# Patient Record
Sex: Male | Born: 1945 | Race: Black or African American | Hispanic: No | Smoking: Current some day smoker
Health system: Southern US, Community
[De-identification: ages and names within clinical notes are randomized; demographics above are authoritative.]

## PROBLEM LIST (undated history)

## (undated) DIAGNOSIS — R7303 Prediabetes: Secondary | ICD-10-CM

## (undated) DIAGNOSIS — E7800 Pure hypercholesterolemia, unspecified: Secondary | ICD-10-CM

## (undated) DIAGNOSIS — I10 Essential (primary) hypertension: Secondary | ICD-10-CM

## (undated) HISTORY — DX: Prediabetes: R73.03

## (undated) HISTORY — PX: KNEE SURGERY: SHX244

## (undated) HISTORY — DX: Pure hypercholesterolemia, unspecified: E78.00

## (undated) HISTORY — DX: Essential (primary) hypertension: I10

---

## 2006-10-05 ENCOUNTER — Emergency Department (HOSPITAL_COMMUNITY): Admission: EM | Admit: 2006-10-05 | Discharge: 2006-10-05 | Payer: Self-pay | Admitting: Emergency Medicine

## 2006-10-09 ENCOUNTER — Emergency Department (HOSPITAL_COMMUNITY): Admission: EM | Admit: 2006-10-09 | Discharge: 2006-10-09 | Payer: Self-pay | Admitting: Emergency Medicine

## 2007-01-25 ENCOUNTER — Ambulatory Visit (HOSPITAL_COMMUNITY): Admission: RE | Admit: 2007-01-25 | Discharge: 2007-01-26 | Payer: Self-pay | Admitting: Orthopedic Surgery

## 2007-02-07 ENCOUNTER — Encounter: Admission: RE | Admit: 2007-02-07 | Discharge: 2007-05-08 | Payer: Self-pay | Admitting: Orthopedic Surgery

## 2009-10-22 ENCOUNTER — Emergency Department (HOSPITAL_COMMUNITY): Admission: EM | Admit: 2009-10-22 | Discharge: 2009-10-22 | Payer: Self-pay | Admitting: Family Medicine

## 2010-02-10 ENCOUNTER — Emergency Department (HOSPITAL_COMMUNITY): Admission: EM | Admit: 2010-02-10 | Discharge: 2010-02-10 | Payer: Self-pay | Admitting: Family Medicine

## 2010-09-04 ENCOUNTER — Encounter: Payer: Self-pay | Admitting: *Deleted

## 2010-09-05 ENCOUNTER — Encounter: Payer: Self-pay | Admitting: *Deleted

## 2010-12-28 NOTE — Op Note (Signed)
NAME:  Bernard Cross, Bernard Cross NO.:  [id number]   MEDICAL RECORD NO.:  [id number]          PATIENT TYPE:  AMB   LOCATION:  SDS                          FACILITY:  [facility]   PHYSICIAN:  Vania Rea. Supple, M.D.  DATE OF BIRTH:  Mar 28, 1946   DATE OF PROCEDURE:  01/25/2007  DATE OF DISCHARGE:                               OPERATIVE REPORT   PREOPERATIVE DIAGNOSIS:  Left knee medial meniscus tear.   POSTOPERATIVE DIAGNOSIS:  1. Left knee medial meniscus tear.  2. Left knee lateral meniscus tear.  3. Left knee thick fibrotic medial plica.   PROCEDURE:  1. Left knee diagnostic arthroscopy.  2. Partial medial and lateral meniscectomies.  3. Medial plica resection.   SURGEON:  Vania Rea. Supple, M.D.   ANESTHESIA:  LMA general plus local.   TOURNIQUET TIME:  None was used.   ESTIMATED BLOOD LOSS:  Minimal.   DRAINS:  None.   HISTORY:  Bernard Cross is a 60-year gentleman who has had chronic left  medial knee pain and mechanical symptoms which have failed to improve  with conservative management.  Due to his ongoing pain and functional  limitations, he is brought to the operating at this time for planned  left knee arthroscopy as described below.  Preoperatively, we counseled  Bernard Cross on treatment options as well as risks versus benefits  thereof.  The possible surgical complication including infection,  neurovascular injury, DVT, PE, persistent pain, loss of motion, and  possible need for additional surgery are reviewed.  He understands,  accepts, and agrees with our planned procedure.   PROCEDURE IN DETAIL:  After undergoing routine preop evaluation, the  patient received prophylactic antibiotics.  A knee block anesthetic was  placed in the holding area by the anesthesia department.  He was placed  supine on the operating table and underwent smooth induction of an LMA  general anesthesia.  The left leg was placed in the leg holder and  sterilely prepped and draped in a  standard fashion.   Standard arthroscopy portals were established and diagnostic arthroscopy  was performed.  The suprapatellar pouch and gutters were clear, although  there were several small cartilaginous loose bodies that we removed  through the arthroscopic cannula.  There was a very thick and large  fibrotic medial plica and this was resected back to its capsular margins  with a shaver and then the Arthrex wand was used to obtain hemostasis.  The patellofemoral joint showed normal articular cartilage and normal  patellar tracking.  The acromial notch showed the ACL to be intact with  a negative intraoperative Lachman.  Medially, there was a radial tear  involving the posterior horn of the medial meniscus.  This area was  trimmed back to a stable peripheral margin with a smooth contour and  then a shaver was brought in for final contouring and removal of  meniscal fragments.  Estimated approximately 25-30% of the meniscus was  excised.  The remaining peripheral rim of the meniscus was stable.  The  articular surfaces were in good condition medially.  Laterally, the  articular surfaces were  in good condition.  There was a degenerative  tear in the mid third of the lateral meniscus which was trimmed back to  a stable peripheral margin with a shaver.   At this point, final inspection and irrigation was completed.  We did  use the Arthrex wand to obtain hemostasis beneath the medial meniscus in  the area which had been partially excised and debrided.  Final  irrigation was completed.  Hemostasis was obtained.  Fluid and  instruments were removed.  A combination of Marcaine with epinephrine  was instilled into the joint and around the portals.  The portals were  closed with Steri-Strips.  A bulky dry dressing was then wrapped about  the left knee and the left leg was wrapped with an Ace bandages.  The  patient was then transferred to the recovery room in stable condition.      Vania Rea. Supple, M.D.  Electronically Signed     KMS/MEDQ  D:  01/25/2007  T:  01/25/2007  Job:  [id number]

## 2011-06-02 LAB — DIFFERENTIAL
Lymphocytes Relative: 50 — ABNORMAL HIGH
Lymphs Abs: 2.5
Monocytes Absolute: 0.4
Monocytes Relative: 8
Neutro Abs: 2
Neutrophils Relative %: 39 — ABNORMAL LOW

## 2011-06-02 LAB — URINALYSIS, ROUTINE W REFLEX MICROSCOPIC
Leukocytes, UA: NEGATIVE
Nitrite: NEGATIVE
Specific Gravity, Urine: 1.026 (ref 1.005–1.035)
Urobilinogen, UA: 1
pH: 6

## 2011-06-02 LAB — COMPREHENSIVE METABOLIC PANEL
Albumin: 3.5
Alkaline Phosphatase: 57
BUN: 11
Calcium: 8.6
Creatinine, Ser: 0.91
Potassium: 3.6
Total Protein: 7.1

## 2011-06-02 LAB — PROTIME-INR: INR: 1

## 2011-06-02 LAB — CBC
Platelets: 228
RBC: 5
WBC: 5.1

## 2011-06-02 LAB — APTT: aPTT: 30

## 2012-09-25 ENCOUNTER — Ambulatory Visit
Admission: RE | Admit: 2012-09-25 | Discharge: 2012-09-25 | Disposition: A | Discharge status: Home / Self Care | Payer: No Typology Code available for payment source | Source: Ambulatory Visit | Attending: Specialist | Admitting: Specialist

## 2012-09-25 ENCOUNTER — Other Ambulatory Visit: Payer: Self-pay | Admitting: Specialist

## 2012-09-25 DIAGNOSIS — R7611 Nonspecific reaction to tuberculin skin test without active tuberculosis: Secondary | ICD-10-CM

## 2013-07-22 IMAGING — CR DG CHEST 1V
1 series · 1 of 1 positions shown · non-contrast
Comparison: Chest x-ray of 01/23/2007

CLINICAL DATA: Positive TB test, cough, smoking history

CHEST - 1 VIEW

[w chest pa]
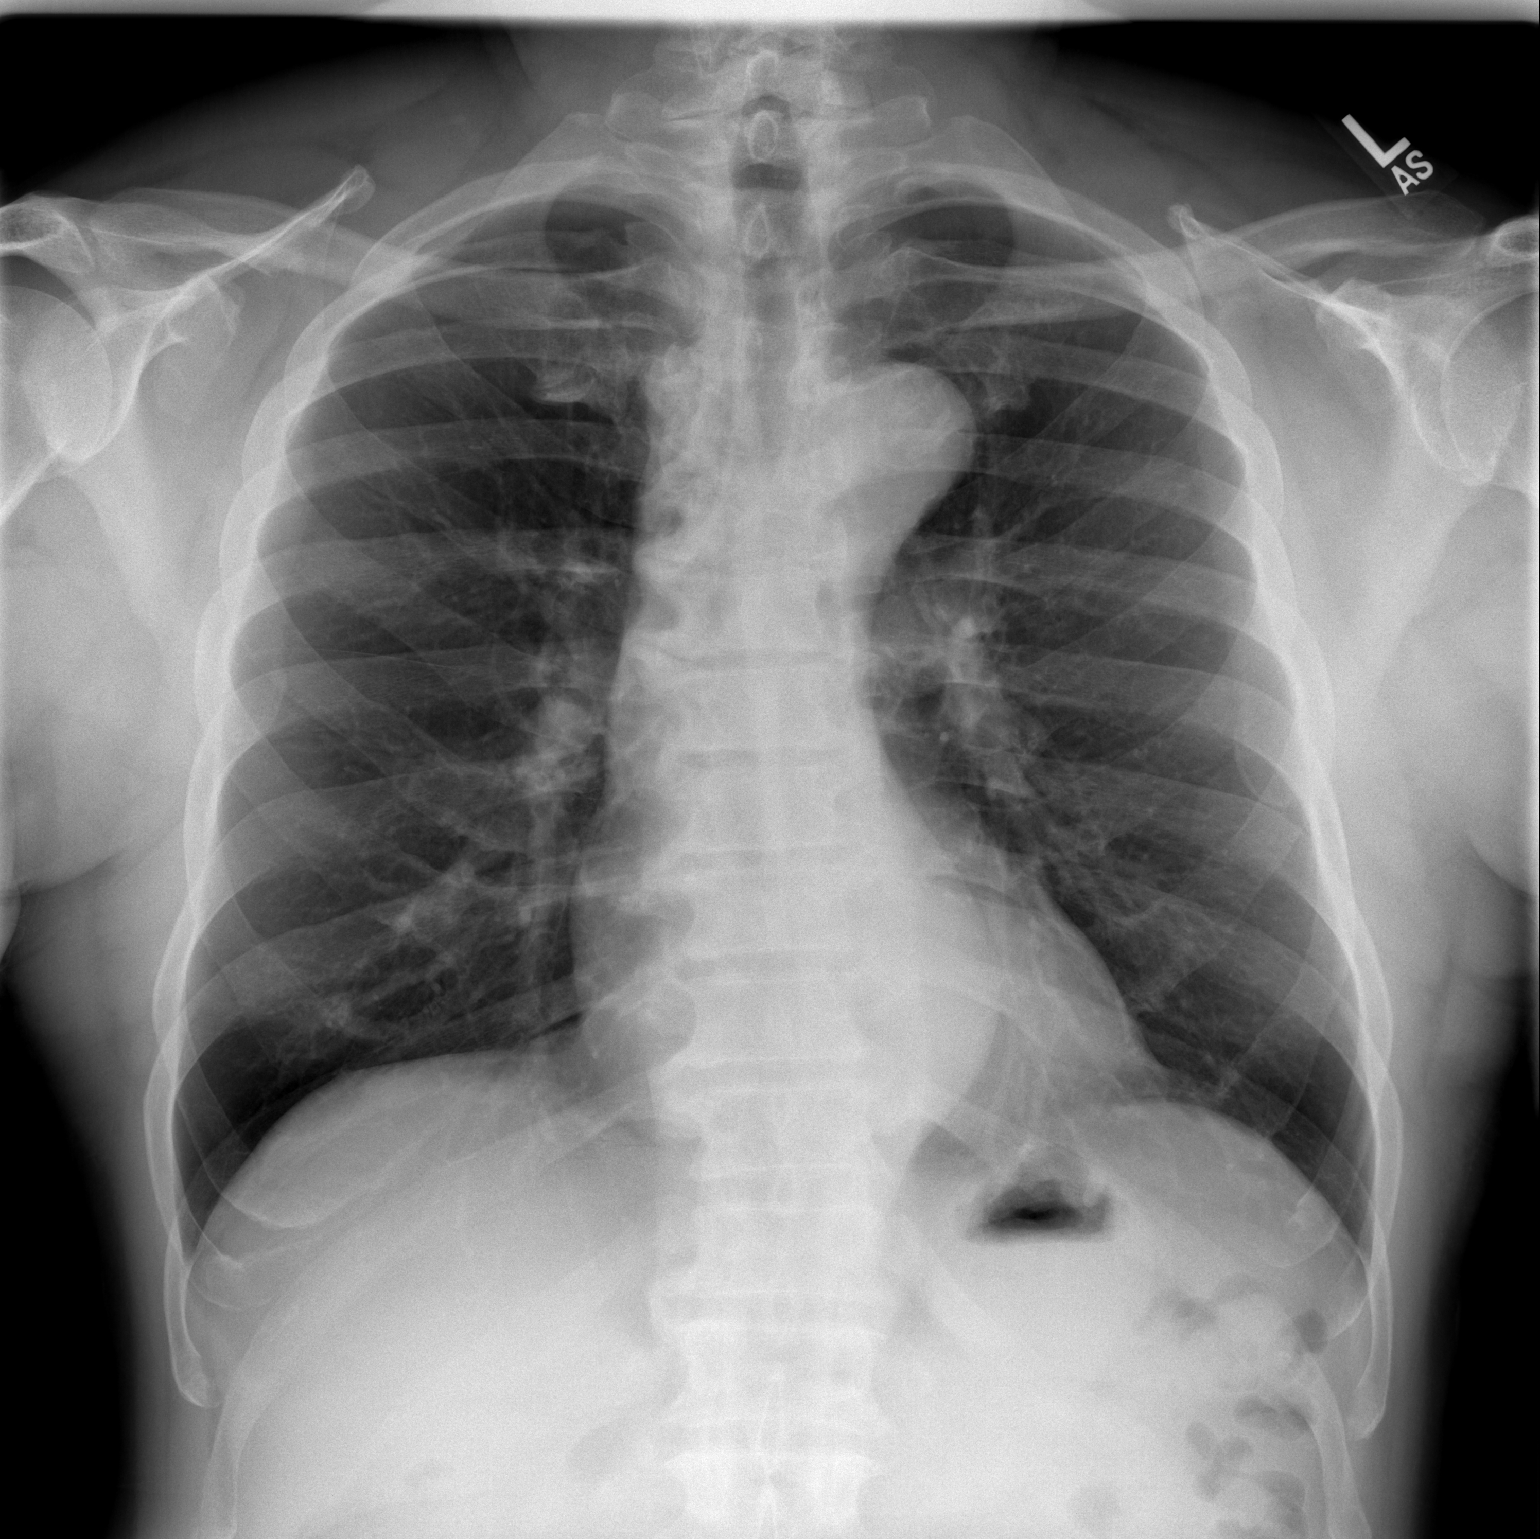

[1 of 1 positions shown; findings below may reference images not displayed]

FINDINGS: No active infiltrate or effusion is seen.  No sequela of
prior tuberculous infection is noted.  Mediastinal contours appear
normal.  The heart is mildly enlarged.  The descending thoracic
aorta is ectatic.  There are degenerative changes throughout the
thoracic spine.
IMPRESSION: No active lung disease.  Stable mild cardiomegaly with ectatic
descending thoracic aorta.

## 2013-07-23 ENCOUNTER — Emergency Department (INDEPENDENT_AMBULATORY_CARE_PROVIDER_SITE_OTHER)
Admission: EM | Admit: 2013-07-23 | Discharge: 2013-07-23 | Disposition: A | Discharge status: Home / Self Care | Payer: Medicare Other | Source: Home / Self Care | Attending: Family Medicine | Admitting: Family Medicine

## 2013-07-23 ENCOUNTER — Encounter (HOSPITAL_COMMUNITY): Payer: Self-pay | Admitting: Emergency Medicine

## 2013-07-23 DIAGNOSIS — M549 Dorsalgia, unspecified: Secondary | ICD-10-CM

## 2013-07-23 DIAGNOSIS — M542 Cervicalgia: Secondary | ICD-10-CM

## 2013-07-23 MED ORDER — DICLOFENAC POTASSIUM 50 MG PO TABS: 50.0000 mg | ORAL_TABLET | Freq: Three times a day (TID) | ORAL | Status: DC

## 2013-07-23 MED ORDER — CYCLOBENZAPRINE HCL 5 MG PO TABS: 5.0000 mg | ORAL_TABLET | Freq: Three times a day (TID) | ORAL | Status: DC | PRN
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
## 2013-08-07 ENCOUNTER — Emergency Department (INDEPENDENT_AMBULATORY_CARE_PROVIDER_SITE_OTHER)
Admission: EM | Admit: 2013-08-07 | Discharge: 2013-08-07 | Disposition: A | Discharge status: Home / Self Care | Payer: Medicare Other | Source: Home / Self Care | Attending: Family Medicine | Admitting: Family Medicine

## 2013-08-07 ENCOUNTER — Encounter (HOSPITAL_COMMUNITY): Payer: Self-pay | Admitting: Emergency Medicine

## 2013-08-07 DIAGNOSIS — J40 Bronchitis, not specified as acute or chronic: Secondary | ICD-10-CM

## 2013-08-07 MED ORDER — AZITHROMYCIN 250 MG PO TABS: 250.0000 mg | ORAL_TABLET | Freq: Every day | ORAL | Status: DC

## 2013-08-07 MED ORDER — HYDROCODONE-ACETAMINOPHEN 5-325 MG PO TABS: 0.5000 | ORAL_TABLET | Freq: Every evening | ORAL | Status: DC | PRN

## 2013-08-07 NOTE — ED Notes (Signed)
67 yr old is here today with complaints of cough-dry, chest congestion; Head aches for 3 dys.

## 2013-08-07 NOTE — ED Provider Notes (Signed)
Bernard Cross is a 67 y.o. male who presents to Urgent Care today for  1) cough nasal congestion, ear pressure, and nasal discharge. No chest pain shortness of breath nausea vomiting or diarrhea. Patient has tried some over-the-counter medications which did not help much. He has been exposed to a lot of sick people recently. 2) hypertension: Patient with elevated blood pressure. He has never been diagnosed with hypertension before. He does not take medications yet.   History reviewed. No pertinent past medical history. History  Substance Use Topics  . Smoking status: Current Some Day Smoker  . Smokeless tobacco: Not on file  . Alcohol Use: Yes   ROS as above Medications reviewed. No current facility-administered medications for this encounter.   Current Outpatient Prescriptions  Medication Sig Dispense Refill  . azithromycin (ZITHROMAX) 250 MG tablet Take 1 tablet (250 mg total) by mouth daily. Take first 2 tablets together, then 1 every day until finished.  6 tablet  0  . HYDROcodone-acetaminophen (NORCO/VICODIN) 5-325 MG per tablet Take 0.5 tablets by mouth at bedtime as needed (cough).  6 tablet  0    Exam:  BP 167/105  Pulse 88  Temp(Src) 100.7 F (38.2 C) (Oral)  Resp 20  SpO2 100% Gen: Well NAD HEENT: EOMI,  MMM, tympanic membranes are normal appearing bilaterally. Posterior pharynx with mild erythema. Lungs: Normal work of breathing. Rhonchorous breath sounds bilaterally. Otherwise clear Heart: RRR no MRG Abd: NABS, Soft. NT, ND Exts: Non edematous BL  LE, warm and well perfused.   No results found for this or any previous visit (from the past 24 hour(s)). No results found.  Assessment and Plan: 67 y.o. male with probable bronchitis. Patient refuses chest x-ray and blood work today. We'll treat with azithromycin and low-dose Norco for cough.  As for blood pressure. He might start patient on antihypertensives however he refuses blood draw today. We'll followup with  primary care provider. Discussed warning signs or symptoms. Please see discharge instructions. Patient expresses understanding.      Rodolph Bong, MD 08/07/13 1340

## 2019-10-17 ENCOUNTER — Ambulatory Visit: Payer: Medicare Other | Attending: Internal Medicine

## 2019-10-17 DIAGNOSIS — Z23 Encounter for immunization: Secondary | ICD-10-CM | POA: Insufficient documentation

## 2019-10-17 NOTE — Progress Notes (Signed)
   Covid-19 Vaccination Clinic  Name:  ELDEN BRUCATO    MRN: [id number] DOB: May 22, 1946  10/17/2019  Mr. Zumstein was observed post Covid-19 immunization for 15 minutes without incident. He was provided with Vaccine Information Sheet and instruction to access the V-Safe system.   Mr. Roudebush was instructed to call 911 with any severe reactions post vaccine: Marland Kitchen Difficulty breathing  . Swelling of face and throat  . A fast heartbeat  . A bad rash all over body  . Dizziness and weakness   Immunizations Administered    Name Date Dose VIS Date Route   Pfizer COVID-19 Vaccine 10/17/2019 10:18 AM 0.3 mL 07/26/2019 Intramuscular   Manufacturer: [employer], [employer]   Lot: [id number]   NDC: [address]-3

## 2019-11-13 ENCOUNTER — Ambulatory Visit: Payer: Medicare Other | Attending: Internal Medicine

## 2019-11-13 DIAGNOSIS — Z23 Encounter for immunization: Secondary | ICD-10-CM

## 2019-11-13 NOTE — Progress Notes (Signed)
   Covid-19 Vaccination Clinic  Name:  NOBEL BRAR    MRN: [id number] DOB: 1946/06/07  11/13/2019  Mr. Wojnarowski was observed post Covid-19 immunization for 15 minutes without incident. He was provided with Vaccine Information Sheet and instruction to access the V-Safe system.   Mr. Gerry was instructed to call 911 with any severe reactions post vaccine: Marland Kitchen Difficulty breathing  . Swelling of face and throat  . A fast heartbeat  . A bad rash all over body  . Dizziness and weakness   Immunizations Administered    Name Date Dose VIS Date Route   Pfizer COVID-19 Vaccine 11/13/2019 11:23 AM 0.3 mL 07/26/2019 Intramuscular   Manufacturer: [employer], [employer]   Lot: [id number]   NDC: [address]-8

## 2022-08-22 ENCOUNTER — Ambulatory Visit: Payer: Medicare Other | Admitting: Neurology

## 2022-08-22 ENCOUNTER — Encounter: Payer: Self-pay | Admitting: Neurology

## 2022-08-30 ENCOUNTER — Ambulatory Visit: Payer: Medicare Other | Admitting: Neurology

## 2022-10-27 ENCOUNTER — Ambulatory Visit (INDEPENDENT_AMBULATORY_CARE_PROVIDER_SITE_OTHER): Payer: 59 | Admitting: Neurology

## 2022-10-27 ENCOUNTER — Encounter: Payer: Self-pay | Admitting: Neurology

## 2022-10-27 VITALS — BP 138/92 | HR 72 | Ht 74.0 in | Wt 228.0 lb

## 2022-10-27 DIAGNOSIS — I639 Cerebral infarction, unspecified: Secondary | ICD-10-CM | POA: Diagnosis not present

## 2022-10-27 DIAGNOSIS — R569 Unspecified convulsions: Secondary | ICD-10-CM | POA: Diagnosis not present

## 2022-10-27 MED ORDER — LEVETIRACETAM 500 MG PO TABS: 500.0000 mg | ORAL_TABLET | Freq: Two times a day (BID) | ORAL | 11 refills | Status: DC

## 2022-10-27 MED ORDER — LEVETIRACETAM 500 MG PO TABS: 500.0000 mg | ORAL_TABLET | Freq: Two times a day (BID) | ORAL | 3 refills | Status: DC

## 2022-10-27 NOTE — Progress Notes (Signed)
Chief Complaint  Patient presents with   New Patient (Initial Visit)    RM 17, alone. Stroke f/u. Had arm weakness. Michela Pitcher his last seizure was one month ago, thinks his sz meds is piracetam.      ASSESSMENT AND PLAN  Bernard Cross is a 77 y.o. 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, 2022, Stroke on Dec 29, 2021,  Stroke was treated at [place], [place], the description of most suggestive of left MCA stroke, presenting with aphasia, mild right-sided weakness, still has mild hesitation in speech,  Reported MRI of the brain was done recently at outside facility, I could not get records through epic system, bring MRI film to review at next visit  EEG  Refill Keppra 500 mg twice daily  No driving until seizure-free for 6 months  Keep aspirin 81 mg daily    Return To Clinic With NP In 6 Months   DIAGNOSTIC DATA (LABS, IMAGING, TESTING) - I reviewed patient records, labs, notes, testing and imaging myself where available.   MEDICAL HISTORY:  Bernard Cross is a 77 year old right-handed male, seen in request by his primary care from [facility] doctor Kristie Cowman, for evaluation of seizure, he drove himself to clinic today October 27, 2022   I reviewed and summarized the referring note. PMHX HLD HTN  He is a native of [place], he immigrated to states about 10 years ago, when he visiting his home country in May 2023, suffered a stroke on May 17 presenting with right-sided weakness, language difficulty, regained significant recovery, since he returned to [place], he stayed with his sister in [place] area,  On September 07, 2022, he had sudden onset forceful head turning towards the right side, then lost consciousness, had witnessed the seizure, was treated at [facility] system,  CT head showed no acute abnormality, he was discharged with Keppra 500 mg twice daily CT cervical spine showed no acute bony abnormality, degenerative  changes, ossification of posterior longitudinal ligament, 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  PHYSICAL EXAMNIATION:  Gen: NAD, conversant, well nourised, well groomed                     Cardiovascular: Regular rate rhythm, no peripheral edema, warm, nontender. Eyes: Conjunctivae clear without exudates or hemorrhage Neck: Supple, no carotid bruits. Pulmonary: Clear to auscultation bilaterally   NEUROLOGICAL EXAM:  MENTAL STATUS: Speech/cognition: Awake, alert, oriented to history taking and casual conversation, but hesitating his speech CRANIAL NERVES: CN II: Visual fields are full to confrontation. Pupils are round equal and briskly reactive to light. CN III, IV, VI: extraocular movement are normal. No ptosis. CN V: Facial sensation is intact to light touch CN VII: Face is symmetric with normal eye closure  CN VIII: Hearing is normal to causal conversation. CN IX, X: Phonation is normal. CN XI: Head turning and shoulder shrug are intact  MOTOR: There is no pronator drift of out-stretched arms. Muscle bulk and tone are normal. Muscle strength is normal.  REFLEXES: Reflexes are 2+ and symmetric at the biceps, triceps, knees, and ankles. Plantar responses are  flexor.  SENSORY: Intact to light touch, pinprick and vibratory sensation are intact in fingers and toes.  COORDINATION: There is no trunk or limb dysmetria noted.  GAIT/STANCE: Posture is normal. Gait is steady with normal steps, base, arm swing, and turning. Heel and toe walking are normal. Tandem gait is normal.  Romberg is absent.  REVIEW OF SYSTEMS:  Full 14 system review of systems performed and notable only for as  above All other review of systems were negative.   ALLERGIES: Allergies  Allergen Reactions   Chloroquine Itching   Iodipamide Meglumine Rash    HOME MEDICATIONS: Current Outpatient Medications  Medication Sig Dispense Refill   aspirin 325 MG tablet Take 325 mg by mouth daily.     atorvastatin (LIPITOR) 40 MG tablet Take 40 mg by mouth at bedtime.     losartan (COZAAR) 50 MG tablet Take 50 mg by mouth daily.     Piracetam POWD by Does not apply route.     No current facility-administered medications for this visit.    PAST MEDICAL HISTORY: Past Medical History:  Diagnosis Date   High cholesterol    Hypertension    Prediabetes     PAST SURGICAL HISTORY: Past Surgical History:  Procedure Laterality Date   KNEE SURGERY      FAMILY HISTORY: Family History  Problem Relation Age of Onset   Breast cancer Mother    Stroke Father     SOCIAL HISTORY: Social History   Socioeconomic History   Marital status: Divorced    Spouse name: Not on file   Number of children: Not on file   Years of education: Not on file   Highest education level: Not on file  Occupational History   Not on file  Tobacco Use   Smoking status: Some Days   Smokeless tobacco: Not on file  Substance and Sexual Activity   Alcohol use: Yes   Drug use: No   Sexual activity: Not Currently  Other Topics Concern   Not on file  Social History Narrative   Not on file   Social Determinants of Health   Financial Resource Strain: Not on file  Food Insecurity: Not on file  Transportation Needs: Not on file  Physical Activity: Not on file  Stress: Not on file  Social Connections: Not on file  Intimate Partner Violence: Not on file      Marcial Pacas, M.D. Ph.D.  [facility] [address], [place], [place] [address] Ph: [phone] Fax: [phone]  CC:  Kristie Cowman, MD [place],  [place] [address]  Kristie Cowman, MD

## 2022-10-27 NOTE — Patient Instructions (Signed)
NO driving until seizure free for 6 months.  Bring MRI brain CD for review.

## 2022-11-09 ENCOUNTER — Other Ambulatory Visit: Payer: 59 | Admitting: *Deleted

## 2022-11-21 ENCOUNTER — Ambulatory Visit (INDEPENDENT_AMBULATORY_CARE_PROVIDER_SITE_OTHER): Payer: 59 | Admitting: Neurology

## 2022-11-21 DIAGNOSIS — R569 Unspecified convulsions: Secondary | ICD-10-CM

## 2022-11-21 DIAGNOSIS — I639 Cerebral infarction, unspecified: Secondary | ICD-10-CM

## 2022-12-22 NOTE — Procedures (Signed)
   HISTORY: 77 year old male presenting with seizure,  TECHNIQUE:  This is a routine 16 channel EEG recording with one channel devoted to a limited EKG recording.  It was performed during wakefulness, drowsiness and asleep.  Hyperventilation and photic stimulation were performed as activating procedures.  There are minimum muscle and movement artifact noted.  Upon maximum arousal, posterior dominant waking rhythm consistent of rhythmic alpha range activity. Activities are symmetric over the bilateral posterior derivations and attenuated with eye opening.  Photic stimulation did not alter the tracing.  Hyperventilation produced mild/moderate buildup with higher amplitude and the slower activities noted.  During EEG recording, patient developed drowsiness and no deeper stage of sleep was achieved During EEG recording, there was no epileptiform discharge noted.  EKG demonstrate normal sinus rhythm.  CONCLUSION: This is a  normal awake EEG.  There is no electrodiagnostic evidence of epileptiform discharge.  Levert Feinstein, M.D. Ph.D.  [facility] [address] [place], [place] [address] Phone: [phone] Fax:      [phone]

## 2022-12-26 ENCOUNTER — Telehealth: Payer: Self-pay | Admitting: Neurology

## 2022-12-26 DIAGNOSIS — R479 Unspecified speech disturbances: Secondary | ICD-10-CM

## 2022-12-26 DIAGNOSIS — R569 Unspecified convulsions: Secondary | ICD-10-CM

## 2022-12-26 DIAGNOSIS — I639 Cerebral infarction, unspecified: Secondary | ICD-10-CM

## 2022-12-26 NOTE — Telephone Encounter (Signed)
Pt is asking for a call with results to his brain scan

## 2022-12-26 NOTE — Telephone Encounter (Signed)
Call to patient, made aware of normal EEG per Dr. Terrace Arabia. Patient is wanting Dr. Terrace Arabia to call and explain further. Attempted to offer further explanation and he would like Dr. Terrace Arabia to follow up. I explained only results we had were EEG and not MRI. Patient verbalized understanding. Will inform Dr. Terrace Arabia.

## 2022-12-27 DIAGNOSIS — R479 Unspecified speech disturbances: Secondary | ICD-10-CM | POA: Insufficient documentation

## 2022-12-27 NOTE — Telephone Encounter (Signed)
I called patient, explained that normal EEG does not rule out the possibility of seizure,  he could continue Keppra, 500 twice a day,  He now lives in [place], once for speech therapy, will call back to inform us the facility he wished to go

## 2023-04-27 ENCOUNTER — Ambulatory Visit: Payer: 59 | Admitting: Adult Health

## 2023-05-16 ENCOUNTER — Encounter: Payer: Self-pay | Admitting: Adult Health

## 2023-05-16 ENCOUNTER — Ambulatory Visit (INDEPENDENT_AMBULATORY_CARE_PROVIDER_SITE_OTHER): Payer: 59 | Admitting: Adult Health

## 2023-05-16 VITALS — BP 123/83 | HR 71 | Ht 74.0 in | Wt 226.0 lb

## 2023-05-16 DIAGNOSIS — R569 Unspecified convulsions: Secondary | ICD-10-CM

## 2023-05-16 DIAGNOSIS — I69398 Other sequelae of cerebral infarction: Secondary | ICD-10-CM | POA: Diagnosis not present

## 2023-05-16 DIAGNOSIS — I639 Cerebral infarction, unspecified: Secondary | ICD-10-CM | POA: Diagnosis not present

## 2023-05-16 MED ORDER — LEVETIRACETAM 500 MG PO TABS: 500.0000 mg | ORAL_TABLET | Freq: Two times a day (BID) | ORAL | 3 refills | Status: DC

## 2023-05-16 MED ORDER — LEVETIRACETAM 500 MG PO TABS: 500.0000 mg | ORAL_TABLET | Freq: Two times a day (BID) | ORAL | 3 refills | Status: AC

## 2023-05-16 NOTE — Patient Instructions (Addendum)
Continue Keppra 500 mg twice daily for seizure prevention  Will follow up with Dr. Terrace Arabia regarding benefit with botox for right hand stiffness  Please let me know if your leg numbness worsens, if you start to experience any numbness/tingling in arm and face in addition to leg or any worsening weakness, please call 911 immediately   Continue aspirin 81 mg daily  and atorvastatin for secondary stroke prevention managed/prescribed by PCP  Continue to follow up with PCP regarding blood pressure and cholesterol management  Maintain strict control of hypertension with blood pressure goal below 130/90 and cholesterol with LDL cholesterol (bad cholesterol) goal below 70 mg/dL.   Signs of a Stroke? Follow the BEFAST method:  Balance Watch for a sudden loss of balance, trouble with coordination or vertigo Eyes Is there a sudden loss of vision in one or both eyes? Or double vision?  Face: Ask the person to smile. Does one side of the face droop or is it numb?  Arms: Ask the person to raise both arms. Does one arm drift downward? Is there weakness or numbness of a leg? Speech: Ask the person to repeat a simple phrase. Does the speech sound slurred/strange? Is the person confused ? Time: If you observe any of these signs, call 911.   Please try to get records from hospital where you were evaluated for your stroke - would like to review your MRI brain and also look to see if vessel of your head and neck were completed (CTA and MRA).      Followup in the future with me in 1 year or call earlier if needed       Thank you for coming to see [place] at [facility]. I hope we have been able to provide you high quality care today.  You may receive a patient satisfaction survey over the next few weeks. We would appreciate your feedback and comments so that we may continue to improve ourselves and the health of our patients.

## 2023-05-16 NOTE — Progress Notes (Signed)
Chief Complaint  Patient presents with   Follow-up    Rm 3, here alone  Pt is here for seizure follow up. Pt states he is doing well, no seizure activity since he had his stroke.      ASSESSMENT AND PLAN  Bernard Cross is a 77 y.o. 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 1 mo onset of RLE numbness (present after stroke but resolved). Discussed repeat MRI brain to ensure no new stroke or extension of prior stroke but declines interest at this time. He was advised to call 911 immediately if he should start to experience numbness in arm and face in addition to leg, worsening weakness, speech changes or any other new stroke/TIA symptoms  Will f/u with Dr. Terrace Arabia regarding possible benefit with botox in RUE for spasticity, hesitant to trial muscle relaxants due to seizure history  Requested patient bring records from hospitalization in [place] for review including MRI brain and CTA or MRA - concern L MCA stroke causing aphasia possibly embolic etiology. Patient reports cardiac monitor completed but unable to verify via epic.   Continue aspirin and atorvastatin for secondary stroke prevention measures managed/prescribed by PCP and continue routine follow-up with PCP for aggressive stroke risk factor management  EEG 11/2022 normal   Continue Keppra 500 mg twice daily for seizure prevention  Advised to call with any seizure activity     Follow-up in 1 year or call earlier if needed    DIAGNOSTIC DATA (LABS, IMAGING, TESTING) - I reviewed patient records, labs, notes, testing and imaging myself where available.   MEDICAL HISTORY:  Update  05/16/2023 JM: Doing well since prior visit.  Denies any seizure activity, reports compliance on Keppra 500 mg twice daily, denies side effects.  Continue mild right-sided weakness and speech impairment, does note increased stiffness of right shoulder and hand, has difficulty making a fist in right hand. Reports initially upon presenting with a stroke, he had numbness in right upper and lower extremity but this resolved. About 1 month ago, started to have RLE numbness. Does have stiffness in lower back but denies pain or any radiating symptoms.  Denies any right arm or facial numbness.  Denies any worsening weakness.  He does keep active with routine exercise.  Remains on aspirin and atorvastatin.  Routinely follows with PCP for stroke risk factor management.     History provided for reference purposes only Consult visit 10/27/2022 Dr. Terrace Arabia: Bernard Cross is a 78 year old right-handed male, seen in request by his primary care from [facility] doctor Knox Royalty, for evaluation of seizure, he drove himself to clinic today October 27, 2022   I reviewed and summarized the referring note. PMHX HLD HTN  He is a native of [place], he immigrated to states about 10 years ago, when he visiting his home country in May 2023, suffered a stroke on May 17 presenting with right-sided weakness, language difficulty, regained significant recovery, since he returned to [place], he stayed with his sister in [place] area,  On September 07, 2022, he had sudden onset forceful head turning towards the right side, then lost consciousness,  had witnessed the seizure, was treated at [facility] system,  CT head showed no acute abnormality, he was discharged with Keppra 500 mg twice daily CT cervical spine showed no acute bony abnormality, degenerative changes, ossification of posterior longitudinal ligament, 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  PHYSICAL EXAMNIATION:  Gen: NAD, very pleasant elderly African male, conversant, well nourised, well groomed                     Cardiovascular: Regular rate rhythm, no peripheral edema, warm, nontender. Eyes: Conjunctivae clear without exudates or hemorrhage Neck: Supple, no carotid bruits. Pulmonary: Clear to auscultation bilaterally   NEUROLOGICAL EXAM:  MENTAL STATUS: Speech/cognition: Awake, alert, oriented to history taking and casual conversation, but hesitating his speech CRANIAL NERVES: CN II: Visual fields are full to confrontation. Pupils are round equal and briskly reactive to light. CN III, IV, VI: extraocular movement are normal. No ptosis. CN V: Facial sensation is intact to light touch CN VII: Face is symmetric with normal eye closure  CN VIII: Hearing is normal to causal conversation. CN IX, X: Phonation is normal. CN XI: Head turning and shoulder shrug are intact  MOTOR: Full strength left upper and lower extremity. Slight weakness right upper and lower extremity, slight decreased grip strength and increased tone right hand.   REFLEXES: Reflexes are 2+ and symmetric at the biceps, triceps, knees, and ankles. Plantar responses are flexor.  SENSORY: Intact to light touch, pinprick and vibratory sensation are intact in fingers and toes.  COORDINATION: There is no trunk or limb dysmetria noted.  GAIT/STANCE: Posture is normal. Gait is steady with normal steps, base, arm swing, and turning. Heel and toe walking are normal. Tandem gait is normal.    REVIEW OF SYSTEMS:  Full 14 system review of systems performed and notable only for as above All other review of systems were  negative.   ALLERGIES: Allergies  Allergen Reactions   Chloroquine Itching   Iodipamide Meglumine Rash    HOME MEDICATIONS: Current Outpatient Medications  Medication Sig Dispense Refill   aspirin 325 MG tablet Take 325 mg by mouth daily.     atorvastatin (LIPITOR) 40 MG tablet Take 40 mg by mouth at bedtime.     levETIRAcetam (KEPPRA) 500 MG tablet Take 1 tablet (500 mg total) by mouth 2 (two) times daily. 180 tablet 3   losartan (COZAAR) 50 MG tablet Take 50 mg by mouth daily.     No current facility-administered medications for this visit.    PAST MEDICAL HISTORY: Past Medical History:  Diagnosis Date   High cholesterol    Hypertension    Prediabetes     PAST SURGICAL HISTORY: Past Surgical History:  Procedure Laterality Date   KNEE SURGERY      FAMILY HISTORY: Family History  Problem Relation Age of Onset   Breast cancer Mother    Stroke Father     SOCIAL HISTORY: Social History   Socioeconomic History   Marital status: Divorced    Spouse name: Not on file   Number of children: Not on file   Years of education: Not on  file   Highest education level: Not on file  Occupational History   Not on file  Tobacco Use   Smoking status: Some Days   Smokeless tobacco: Not on file  Substance and Sexual Activity   Alcohol use: Yes   Drug use: No   Sexual activity: Not Currently  Other Topics Concern   Not on file  Social History Narrative   Not on file   Social Determinants of Health   Financial Resource Strain: Not on file  Food Insecurity: Not on file  Transportation Needs: Not on file  Physical Activity: Not on file  Stress: Not on file  Social Connections: Unknown (09/07/2022)   Received from [facility], [facility]   Social Network    Social Network: Not on file  Intimate Partner Violence: Unknown (09/07/2022)   Received from [facility], [facility]   HITS    Physically Hurt: Not on file    Insult or Talk Down To: Not on file     Threaten Physical Harm: Not on file    Scream or Curse: Not on file      I spent 40 minutes of face-to-face and non-face-to-face time with patient.  This included previsit chart review, lab review, study review, order entry, electronic health record documentation, patient education and discussion regarding above diagnoses and treatment plan and answered all other questions to patient's satisfaction  Ihor Austin, [facility]  [facility] [address] [place], [place] [address]  Phone [phone] Fax [phone] Note: This document was prepared with digital dictation and possible smart phrase technology. Any transcriptional errors that result from this process are unintentional.

## 2023-05-31 ENCOUNTER — Telehealth: Payer: Self-pay

## 2023-05-31 DIAGNOSIS — G8111 Spastic hemiplegia affecting right dominant side: Secondary | ICD-10-CM

## 2023-05-31 NOTE — Telephone Encounter (Signed)
Which dx code are we using for Xeomin?

## 2023-05-31 NOTE — Telephone Encounter (Signed)
Xeomin G2952  [id number] ELECTRICAL STIMULATION  300 UNITS  CHEMICAL DENERVATION OF LIMBS/TRUNK MUSCLES 64642 1-4 LIMB MUSCLES 64643 ADDITIONAL 1-4 LIMB MUSCLES 64644 5 OR  LIMB MUSCLES 64645 EACH ADDITIONAL 5 OR MORE (LIMB MUSCLES) 64646 TRUNK MUSCLES (ERECTOR SPINAE OR PARA RECTUS) 1-5 MUSCLES 64647 6 OR MORE MUSCLES   SPASTIC HEMIPLEGIA

## 2023-05-31 NOTE — Telephone Encounter (Signed)
Submitted auth via [employer] portal, received instant approval. Auth#: [id number] (05/31/23-05/30/24)  LVM asking pt to call back and schedule first Xeomin appt.

## 2023-05-31 NOTE — Telephone Encounter (Signed)
G81.11

## 2023-06-05 NOTE — Telephone Encounter (Signed)
Pt scheduled for initial Xeomin injection with Dr. Terrace Arabia for 07/26/23 at 3pm

## 2023-07-26 ENCOUNTER — Ambulatory Visit: Payer: 59 | Admitting: Neurology

## 2023-07-26 ENCOUNTER — Encounter: Payer: Self-pay | Admitting: Neurology

## 2023-10-25 ENCOUNTER — Ambulatory Visit: Payer: 59 | Admitting: Neurology

## 2024-05-15 ENCOUNTER — Ambulatory Visit: Payer: 59 | Admitting: Adult Health

## 2024-05-15 ENCOUNTER — Encounter: Payer: Self-pay | Admitting: Adult Health

## 2024-05-15 NOTE — Progress Notes (Deleted)
 No chief complaint on file.    ASSESSMENT AND PLAN  Bernard Cross is a 78 y.o. 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 1 mo onset of RLE numbness (present after stroke but resolved). Discussed repeat MRI brain to ensure no new stroke or extension of prior stroke but declines interest at this time. He was advised to call 911 immediately if he should start to experience numbness in arm and face in addition to leg, worsening weakness, speech changes or any other new stroke/TIA symptoms  Will f/u with Dr. Onita regarding possible benefit with botox in RUE for spasticity, hesitant to trial muscle relaxants due to seizure history  Requested patient bring records from hospitalization in [place] for review including MRI brain and CTA or MRA - concern L MCA stroke causing aphasia possibly embolic etiology. Patient reports cardiac monitor completed but unable to verify via epic.   Continue aspirin and atorvastatin for secondary stroke prevention measures managed/prescribed by PCP and continue routine follow-up with PCP for aggressive stroke risk factor management  EEG 11/2022 normal   Continue Keppra  500 mg twice daily for seizure prevention  Advised to call with any seizure activity     Follow-up in 1 year or call earlier if needed    DIAGNOSTIC DATA (LABS, IMAGING, TESTING) - I reviewed patient records, labs, notes, testing and imaging myself where available.   MEDICAL HISTORY:   Update 05/15/2024 JM: Patient returns for 1 year stroke follow-up.  At prior visit, complained of increased right sided spasticity and was scheduled for Xeomin  injection with Dr. Onita but no-showed initial visit and canceled follow-up visit.   No new stroke/TIA symptoms.  Remains on aspirin and atorvastatin without side effects.  Reports routine follow-up with PCP for stroke risk factor management.  Denies any seizure activity.  Reports compliance on Keppra  500 mg twice daily without side effects.     History provided for reference purposes only Update 05/16/2023 JM: Doing well since prior visit.  Denies any seizure activity, reports compliance on Keppra  500 mg twice daily, denies side effects.  Continue mild right-sided weakness and speech impairment, does note increased stiffness of right shoulder and hand, has difficulty making a fist in right hand. Reports initially upon presenting with a stroke, he had numbness in right upper and lower extremity but this resolved. About 1 month ago, started to have RLE numbness. Does have stiffness in lower back but denies pain or any radiating symptoms.  Denies any right arm or facial numbness.  Denies any worsening weakness.  He does keep active with routine exercise.  Remains on aspirin and atorvastatin.  Routinely follows with PCP for stroke risk factor management.  Consult visit 10/27/2022 Dr. Onita: Bernard Cross is a 78 year old right-handed male, seen in request by his primary care from [facility] doctor Bernard Cross, for evaluation of seizure, he drove himself to clinic today October 27, 2022   I reviewed and summarized the referring note. PMHX HLD HTN  He is a native of [place], he immigrated to states about [place] years ago, when he visiting his home country in May 2023, suffered  a stroke on May 17 presenting with right-sided weakness, language difficulty, regained significant recovery, since he returned to [place] , he stayed with his sister in [place] area,  On September 07, 2022, he had sudden onset forceful head turning towards the right side, then lost consciousness, had  witnessed the seizure, was treated at [facility] system,  CT head showed no acute abnormality, he was discharged with Keppra  500 mg twice daily CT cervical spine showed no acute bony abnormality, degenerative changes, ossification of posterior longitudinal ligament, 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    There is no height or weight on file to calculate BMI.  PHYSICAL EXAMNIATION:  Gen: NAD, very pleasant elderly African male, conversant, well nourised, well groomed                     Cardiovascular: Regular rate rhythm, no peripheral edema, warm, nontender. Eyes: Conjunctivae clear without exudates or hemorrhage Neck: Supple, no carotid bruits. Pulmonary: Clear to auscultation bilaterally   NEUROLOGICAL EXAM:  MENTAL STATUS: Speech/cognition: Awake, alert, oriented to history taking and casual conversation, but hesitating his speech CRANIAL NERVES: CN II: Visual fields are full to confrontation. Pupils are round equal and briskly reactive to light. CN III, IV, VI: extraocular movement are normal. No ptosis. CN V: Facial sensation is intact to light touch CN VII: Face is symmetric with normal eye closure  CN VIII: Hearing is normal to causal conversation. CN IX, X: Phonation is normal. CN XI: Head turning and shoulder shrug are intact  MOTOR: Full strength left upper and lower extremity. Slight weakness right upper and lower extremity, slight decreased grip strength and increased tone right hand.   REFLEXES: Reflexes are 2+ and symmetric at the biceps, triceps, knees, and ankles. Plantar responses are flexor.  SENSORY: Intact to light touch, pinprick and vibratory sensation are intact in fingers and toes.  COORDINATION: There is no trunk or limb dysmetria  noted.  GAIT/STANCE: Posture is normal. Gait is steady with normal steps, base, arm swing, and turning. Heel and toe walking are normal. Tandem gait is normal.    REVIEW OF SYSTEMS:  Full 14 system review of systems performed and notable only for as above All other review of systems were negative.   ALLERGIES: Allergies  Allergen Reactions   Chloroquine Itching   Iodipamide Meglumine Rash    HOME MEDICATIONS: Current Outpatient Medications  Medication Sig Dispense Refill   aspirin 325 MG tablet Take 325 mg by mouth daily.     atorvastatin (LIPITOR) 40 MG tablet Take 40 mg by mouth at bedtime.     levETIRAcetam  (KEPPRA ) 500 MG tablet Take 1 tablet (500 mg total) by mouth 2 (two) times daily. 180 tablet 3   losartan (COZAAR) 50 MG tablet Take 50 mg by mouth daily.     No current facility-administered medications for this visit.    PAST MEDICAL HISTORY: Past Medical History:  Diagnosis Date   High cholesterol    Hypertension    Prediabetes     PAST SURGICAL HISTORY: Past Surgical History:  Procedure Laterality Date   KNEE SURGERY      FAMILY HISTORY: Family History  Problem Relation Age of Onset   Breast cancer Mother    Stroke Father  SOCIAL HISTORY: Social History   Socioeconomic History   Marital status: Divorced    Spouse name: Not on file   Number of children: Not on file   Years of education: Not on file   Highest education level: Not on file  Occupational History   Not on file  Tobacco Use   Smoking status: Some Days   Smokeless tobacco: Not on file  Substance and Sexual Activity   Alcohol use: Yes   Drug use: No   Sexual activity: Not Currently  Other Topics Concern   Not on file  Social History Narrative   Not on file   Social Drivers of Health   Financial Resource Strain: Not on file  Food Insecurity: Not on file  Transportation Needs: Not on file  Physical Activity: Not on file  Stress: Not on file  Social Connections: Unknown  (09/07/2022)   Received from [facility]   Social Network    Social Network: Not on file  Intimate Partner Violence: Not At Risk (05/10/2024)   Received from [facility]   HITS    Over the last 12 months how often did your partner physically hurt you?: Never    Over the last 12 months how often did your partner insult you or talk down to you?: Never    Over the last 12 months how often did your partner threaten you with physical harm?: Never    Over the last 12 months how often did your partner scream or curse at you?: Never      I personally spent a total of *** minutes in the care of the patient today including {Time Based Coding:210964241}.   Harlene Bogaert, AGNP-BC  [facility] [address] [place], [place] [address]  Phone [phone] Fax [phone] Note: This document was prepared with digital dictation and possible smart phrase technology. Any transcriptional errors that result from this process are unintentional.
